# Patient Record
Sex: Female | Born: 1970 | Race: White | Hispanic: No | Marital: Single
Health system: Southern US, Community
[De-identification: ages and names within clinical notes are randomized; demographics above are authoritative.]

---

## 1998-01-19 ENCOUNTER — Emergency Department (HOSPITAL_COMMUNITY): Admission: EM | Admit: 1998-01-19 | Discharge: 1998-01-19 | Payer: Self-pay | Admitting: Emergency Medicine

## 1998-04-15 ENCOUNTER — Emergency Department (HOSPITAL_COMMUNITY): Admission: EM | Admit: 1998-04-15 | Discharge: 1998-04-15 | Payer: Self-pay | Admitting: Emergency Medicine

## 1998-04-17 ENCOUNTER — Emergency Department (HOSPITAL_COMMUNITY): Admission: EM | Admit: 1998-04-17 | Discharge: 1998-04-17 | Payer: Self-pay | Admitting: Emergency Medicine

## 1998-04-17 ENCOUNTER — Encounter: Payer: Self-pay | Admitting: Emergency Medicine

## 1998-11-28 ENCOUNTER — Emergency Department (HOSPITAL_COMMUNITY): Admission: EM | Admit: 1998-11-28 | Discharge: 1998-11-28 | Payer: Self-pay | Admitting: Emergency Medicine

## 1999-04-11 ENCOUNTER — Encounter: Payer: Self-pay | Admitting: Emergency Medicine

## 1999-04-11 ENCOUNTER — Emergency Department (HOSPITAL_COMMUNITY): Admission: EM | Admit: 1999-04-11 | Discharge: 1999-04-11 | Payer: Self-pay | Admitting: Emergency Medicine

## 1999-05-30 ENCOUNTER — Emergency Department (HOSPITAL_COMMUNITY): Admission: EM | Admit: 1999-05-30 | Discharge: 1999-05-30 | Payer: Self-pay | Admitting: Emergency Medicine

## 1999-08-20 ENCOUNTER — Inpatient Hospital Stay (HOSPITAL_COMMUNITY): Admission: AD | Admit: 1999-08-20 | Discharge: 1999-08-20 | Payer: Self-pay | Admitting: Obstetrics & Gynecology

## 1999-09-27 ENCOUNTER — Emergency Department (HOSPITAL_COMMUNITY): Admission: EM | Admit: 1999-09-27 | Discharge: 1999-09-27 | Payer: Self-pay | Admitting: Emergency Medicine

## 1999-09-28 ENCOUNTER — Encounter: Payer: Self-pay | Admitting: Emergency Medicine

## 1999-09-28 ENCOUNTER — Emergency Department (HOSPITAL_COMMUNITY): Admission: EM | Admit: 1999-09-28 | Discharge: 1999-09-28 | Payer: Self-pay | Admitting: Emergency Medicine

## 1999-12-09 ENCOUNTER — Encounter (INDEPENDENT_AMBULATORY_CARE_PROVIDER_SITE_OTHER): Payer: Self-pay | Admitting: *Deleted

## 1999-12-09 LAB — CONVERTED CEMR LAB

## 2000-01-02 ENCOUNTER — Encounter: Admission: RE | Admit: 2000-01-02 | Discharge: 2000-01-02 | Payer: Self-pay | Admitting: Sports Medicine

## 2000-01-23 ENCOUNTER — Encounter: Admission: RE | Admit: 2000-01-23 | Discharge: 2000-01-23 | Payer: Self-pay | Admitting: Family Medicine

## 2000-01-31 ENCOUNTER — Encounter: Payer: Self-pay | Admitting: Family Medicine

## 2000-01-31 ENCOUNTER — Encounter: Admission: RE | Admit: 2000-01-31 | Discharge: 2000-01-31 | Payer: Self-pay | Admitting: Family Medicine

## 2000-03-04 ENCOUNTER — Encounter: Admission: RE | Admit: 2000-03-04 | Discharge: 2000-03-04 | Payer: Self-pay | Admitting: Family Medicine

## 2000-03-11 ENCOUNTER — Encounter: Payer: Self-pay | Admitting: Family Medicine

## 2000-03-11 ENCOUNTER — Encounter: Admission: RE | Admit: 2000-03-11 | Discharge: 2000-03-11 | Payer: Self-pay | Admitting: Family Medicine

## 2000-08-22 ENCOUNTER — Emergency Department (HOSPITAL_COMMUNITY): Admission: EM | Admit: 2000-08-22 | Discharge: 2000-08-22 | Payer: Self-pay | Admitting: Emergency Medicine

## 2000-08-22 ENCOUNTER — Encounter: Payer: Self-pay | Admitting: Emergency Medicine

## 2004-09-20 ENCOUNTER — Emergency Department: Payer: Self-pay | Admitting: Emergency Medicine

## 2005-03-10 ENCOUNTER — Emergency Department: Payer: Self-pay | Admitting: Emergency Medicine

## 2005-06-02 ENCOUNTER — Emergency Department: Payer: Self-pay | Admitting: Emergency Medicine

## 2005-09-13 ENCOUNTER — Emergency Department: Payer: Self-pay | Admitting: Emergency Medicine

## 2005-09-13 ENCOUNTER — Other Ambulatory Visit: Payer: Self-pay

## 2005-09-21 ENCOUNTER — Other Ambulatory Visit: Payer: Self-pay

## 2005-09-21 ENCOUNTER — Emergency Department: Payer: Self-pay | Admitting: Emergency Medicine

## 2006-01-28 ENCOUNTER — Other Ambulatory Visit: Payer: Self-pay

## 2006-01-28 ENCOUNTER — Emergency Department: Payer: Self-pay | Admitting: Emergency Medicine

## 2006-01-30 ENCOUNTER — Ambulatory Visit: Payer: Self-pay | Admitting: Emergency Medicine

## 2006-05-15 ENCOUNTER — Inpatient Hospital Stay: Payer: Self-pay | Admitting: Internal Medicine

## 2006-05-15 ENCOUNTER — Other Ambulatory Visit: Payer: Self-pay

## 2006-06-07 ENCOUNTER — Encounter (INDEPENDENT_AMBULATORY_CARE_PROVIDER_SITE_OTHER): Payer: Self-pay | Admitting: *Deleted

## 2007-02-12 ENCOUNTER — Emergency Department: Payer: Self-pay | Admitting: Emergency Medicine

## 2007-08-18 ENCOUNTER — Emergency Department: Payer: Self-pay | Admitting: Emergency Medicine

## 2007-08-20 ENCOUNTER — Inpatient Hospital Stay: Payer: Self-pay | Admitting: Internal Medicine

## 2007-11-20 ENCOUNTER — Emergency Department: Payer: Self-pay | Admitting: Emergency Medicine

## 2007-12-23 ENCOUNTER — Emergency Department: Payer: Self-pay | Admitting: Emergency Medicine

## 2008-06-08 ENCOUNTER — Inpatient Hospital Stay: Payer: Self-pay | Admitting: Internal Medicine

## 2009-03-05 ENCOUNTER — Emergency Department: Payer: Self-pay | Admitting: Unknown Physician Specialty

## 2009-07-20 ENCOUNTER — Emergency Department: Payer: Self-pay | Admitting: Emergency Medicine

## 2009-08-31 ENCOUNTER — Emergency Department: Payer: Self-pay | Admitting: Internal Medicine

## 2009-09-30 ENCOUNTER — Ambulatory Visit: Payer: Self-pay | Admitting: Internal Medicine

## 2009-11-04 ENCOUNTER — Emergency Department: Payer: Self-pay | Admitting: Emergency Medicine

## 2009-11-13 ENCOUNTER — Inpatient Hospital Stay: Payer: Self-pay | Admitting: Internal Medicine

## 2010-03-01 ENCOUNTER — Emergency Department: Payer: Self-pay | Admitting: Emergency Medicine

## 2010-07-27 ENCOUNTER — Ambulatory Visit (HOSPITAL_COMMUNITY)
Admission: RE | Admit: 2010-07-27 | Discharge: 2010-07-27 | Disposition: A | Discharge status: Home / Self Care | Payer: BC Managed Care – HMO | Source: Ambulatory Visit | Attending: Neurosurgery | Admitting: Neurosurgery

## 2010-07-27 ENCOUNTER — Other Ambulatory Visit (HOSPITAL_COMMUNITY): Payer: Self-pay | Admitting: Neurosurgery

## 2010-07-27 ENCOUNTER — Encounter (HOSPITAL_COMMUNITY)
Admission: RE | Admit: 2010-07-27 | Discharge: 2010-07-27 | Disposition: A | Discharge status: Home / Self Care | Payer: BC Managed Care – HMO | Source: Ambulatory Visit | Attending: Neurosurgery | Admitting: Neurosurgery

## 2010-07-27 DIAGNOSIS — Z01812 Encounter for preprocedural laboratory examination: Secondary | ICD-10-CM | POA: Insufficient documentation

## 2010-07-27 DIAGNOSIS — Z01810 Encounter for preprocedural cardiovascular examination: Secondary | ICD-10-CM | POA: Insufficient documentation

## 2010-07-27 DIAGNOSIS — Z01811 Encounter for preprocedural respiratory examination: Secondary | ICD-10-CM

## 2010-07-27 DIAGNOSIS — Z01818 Encounter for other preprocedural examination: Secondary | ICD-10-CM | POA: Insufficient documentation

## 2010-07-27 LAB — CBC
HCT: 46.5 % — ABNORMAL HIGH (ref 36.0–46.0)
Hemoglobin: 15.9 g/dL — ABNORMAL HIGH (ref 12.0–15.0)
MCH: 30.9 pg (ref 26.0–34.0)
MCV: 90.3 fL (ref 78.0–100.0)
Platelets: 349 10*3/uL (ref 150–400)
RBC: 5.15 MIL/uL — ABNORMAL HIGH (ref 3.87–5.11)

## 2010-07-27 LAB — BASIC METABOLIC PANEL
BUN: 8 mg/dL (ref 6–23)
CO2: 30 mEq/L (ref 19–32)
Chloride: 105 mEq/L (ref 96–112)
Creatinine, Ser: 0.62 mg/dL (ref 0.4–1.2)
Potassium: 4.4 mEq/L (ref 3.5–5.1)

## 2010-07-27 LAB — HCG, SERUM, QUALITATIVE: Preg, Serum: NEGATIVE

## 2010-07-31 ENCOUNTER — Observation Stay (HOSPITAL_COMMUNITY): Payer: BC Managed Care – HMO

## 2010-07-31 ENCOUNTER — Observation Stay (HOSPITAL_COMMUNITY)
Admission: RE | Admit: 2010-07-31 | Discharge: 2010-08-01 | Disposition: A | Discharge status: Home / Self Care | Payer: BC Managed Care – HMO | Source: Ambulatory Visit | Attending: Neurosurgery | Admitting: Neurosurgery

## 2010-07-31 DIAGNOSIS — M5020 Other cervical disc displacement, unspecified cervical region: Secondary | ICD-10-CM | POA: Insufficient documentation

## 2010-07-31 DIAGNOSIS — M47812 Spondylosis without myelopathy or radiculopathy, cervical region: Principal | ICD-10-CM | POA: Insufficient documentation

## 2010-08-06 ENCOUNTER — Inpatient Hospital Stay (HOSPITAL_COMMUNITY)
Admission: EM | Admit: 2010-08-06 | Discharge: 2010-08-08 | DRG: 397 | Disposition: A | Discharge status: Home / Self Care | Payer: BC Managed Care – HMO | Attending: Neurosurgery | Admitting: Neurosurgery

## 2010-08-06 DIAGNOSIS — Y92009 Unspecified place in unspecified non-institutional (private) residence as the place of occurrence of the external cause: Secondary | ICD-10-CM

## 2010-08-06 DIAGNOSIS — Z86711 Personal history of pulmonary embolism: Secondary | ICD-10-CM

## 2010-08-06 DIAGNOSIS — R1310 Dysphagia, unspecified: Secondary | ICD-10-CM | POA: Diagnosis present

## 2010-08-06 DIAGNOSIS — R233 Spontaneous ecchymoses: Principal | ICD-10-CM | POA: Diagnosis present

## 2010-08-06 DIAGNOSIS — Z981 Arthrodesis status: Secondary | ICD-10-CM

## 2010-08-06 DIAGNOSIS — T45515A Adverse effect of anticoagulants, initial encounter: Secondary | ICD-10-CM | POA: Diagnosis present

## 2010-08-06 DIAGNOSIS — Z86718 Personal history of other venous thrombosis and embolism: Secondary | ICD-10-CM

## 2010-08-06 LAB — CBC
Hemoglobin: 12.7 g/dL (ref 12.0–15.0)
MCH: 29.9 pg (ref 26.0–34.0)
MCHC: 32.7 g/dL (ref 30.0–36.0)
MCV: 91.3 fL (ref 78.0–100.0)
Platelets: 312 10*3/uL (ref 150–400)
RBC: 4.25 MIL/uL (ref 3.87–5.11)

## 2010-08-06 LAB — GLUCOSE, CAPILLARY

## 2010-08-06 LAB — PROTIME-INR
INR: 2.06 — ABNORMAL HIGH (ref 0.00–1.49)
Prothrombin Time: 23.4 seconds — ABNORMAL HIGH (ref 11.6–15.2)

## 2010-08-06 LAB — BASIC METABOLIC PANEL
BUN: 6 mg/dL (ref 6–23)
CO2: 28 mEq/L (ref 19–32)
Calcium: 8.7 mg/dL (ref 8.4–10.5)
Chloride: 100 mEq/L (ref 96–112)
Creatinine, Ser: 0.65 mg/dL (ref 0.4–1.2)
GFR calc Af Amer: >60 mL/min (ref >60)

## 2010-08-06 LAB — APTT: aPTT: 62 seconds — ABNORMAL HIGH (ref 24–37)

## 2010-08-07 ENCOUNTER — Inpatient Hospital Stay (HOSPITAL_COMMUNITY): Payer: BC Managed Care – HMO

## 2010-08-07 LAB — APTT: aPTT: 56 seconds — ABNORMAL HIGH (ref 24–37)

## 2010-08-07 LAB — PROTIME-INR
INR: 2.05 — ABNORMAL HIGH (ref 0.00–1.49)
INR: 1.19 (ref 0.00–1.49)
INR: 1.12 (ref 0.00–1.49)
INR: 1.12 (ref 0.00–1.49)
Prothrombin Time: 23.3 seconds — ABNORMAL HIGH (ref 11.6–15.2)
Prothrombin Time: 14.6 seconds (ref 11.6–15.2)

## 2010-08-07 LAB — GLUCOSE, CAPILLARY
Glucose-Capillary: 127 mg/dL — ABNORMAL HIGH (ref 70–99)
Glucose-Capillary: 155 mg/dL — ABNORMAL HIGH (ref 70–99)
Glucose-Capillary: 150 mg/dL — ABNORMAL HIGH (ref 70–99)

## 2010-08-07 LAB — TYPE AND SCREEN: ABO/RH(D): O NEG

## 2010-08-07 LAB — ABO/RH: ABO/RH(D): O NEG

## 2010-08-07 MED ORDER — IOHEXOL 300 MG/ML  SOLN
75.0000 mL | Freq: Once | INTRAMUSCULAR | Status: AC | PRN
  Administered 2010-08-07: 75 mL via INTRAVENOUS

## 2010-08-08 ENCOUNTER — Inpatient Hospital Stay (HOSPITAL_COMMUNITY): Payer: BC Managed Care – HMO

## 2010-08-08 LAB — GLUCOSE, CAPILLARY
Glucose-Capillary: 196 mg/dL — ABNORMAL HIGH (ref 70–99)
Glucose-Capillary: 170 mg/dL — ABNORMAL HIGH (ref 70–99)

## 2010-08-08 LAB — PREPARE FRESH FROZEN PLASMA
Unit division: 0
Unit division: 0
Unit division: 0

## 2010-08-08 LAB — PROTIME-INR: INR: 1.08 (ref 0.00–1.49)

## 2010-08-10 NOTE — Op Note (Signed)
NAMECECILIE, Sharon Strickland             ACCOUNT NO.:  [id number]  MEDICAL RECORD NO.:  [id number]           PATIENT TYPE:  O  LOCATION:  3533                         FACILITY:  [facility]  PHYSICIAN:  Donalee Citrin, M.D.        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  PROCEDURE:  Intracervical diskectomies and fusion at C4-5 and C5-6 using Globus PEEK cages, packed with locally harvested autograft mixed with Actifuse, and Atlantis translational plate with six 16-XW fixed angle screws.  SURGEON:  Donalee Citrin, MD.  ASSISTANT:  Kathaleen Maser. Pool, MD.  ANESTHESIA:  General endotracheal.  HISTORY OF PRESENT ILLNESS:  The patient is a 40 year old female with longstanding neck and bilateral arm pain with weakness in the right biceps, triceps, numbness and tingling in her right arm.  MRI scan showed large disk herniation, causing severe spinal cord compression, right C6 nerve root compression, as well as the C4-5 severe spinal cord compression and left C5 nerve root compression.  Due to the patient's clinical exam, MRI findings, failure to conservative treatment, the patient is recommended anterior cervical diskectomies and fusion with the risks and benefits of the operation were explained to the patient. She understood and agreed to proceed forth.  DESCRIPTION OF PROCEDURE:  The patient was brought to the OR, was induced under general anesthesia, positioned supine, with then neck in slight extension and 5 pounds of halter traction.  The right side of her neck was prepped and draped in usual sterile fashion.  Preop incision was localized at the appropriate level.  A curvilinear incision was made and drawn out just off the midline to the anterior border of  the sternocleidomastoid.  The superficial layer of the platysma was dissected out and divided longitudinally.  The avascular plane between the sternocleidomastoid and strap muscle was developed down to the prevertebral fascia.  Prevertebral fascia was dissected with Kitners. The intraoperative x-ray identified at the appropriate level, so annulotomy was made with 15-mm scalpel to mark the disk space and then both disk spaces were identified.  Longus colli was reflected laterally and self-retaining retractor was placed.  Then, both disk spaces were incised.  Large anterior osteophytes were bitten off with 3-mm Kerrison punch and then high-speed drill was drilled down both disk spaces and capturing the bone shavings in mucus trap.  At this point, the operating microscope was draped, brought into the field, and under microscopic illumination, first working at C5-6 disk space was further drilled down the post annulus.  Then, a 1-mm Kerrison punch was used underbite the medial and posterior end plates, and then marching across laterally, the left-sided dura was identified.  Left C6 pedicle was identified and left C6 nerve root was skeletonized, flushed with pedicle, and marching across the right.  Several large free fragments additionally removed from the right C6 foramen.  These were all teased away with a nerve hook and removed  with pituitary rongeurs and then 2-mm Kerrison punch was used to radically skeletonize the C6 nerve root, flushed with pedicle to confirm adequate decompression.  Again, aggressive under biting at both endplates, decompressed the central canal.  This was then packed with Gelfoam.  Attention was then taken to the C4-5.  In similar fashion, C4- 5 was drilled down.  C4-5 compression was predominantly osteophyte and coming off the C4 vertebral body displacing the left side of the spinal cord.  This was all aggressively under bitten.  Both C5 pedicles were identified.   Both C5 nerve roots were under bitten and then both endplates were scraped.  Two PEEK cages, 8 lordotic at C4-5 and 8 straight at C5-6 were all placed after being packed with locally harvested autograft mixed with Actifuse, and then a 40-mm Atlantis translational plate was placed.  All screws had excellent purchase. Locking mechanisms were engaged.  Wounds were then copiously irrigated. Meticulous hemostasis was maintained.  Postop fluoroscopy confirmed good position of plate screws and bone graft.  The wounds were closed in layers with interrupted Vicryl, and the skin was closed with a running 4- 0 subcuticular.  Benzoin and Steri-Strips were applied.  The patient went to the recovery room in stable condition.  At the end of the case, sponge and instrument count were correct.          ______________________________ Donalee Citrin, M.D.     GC/MEDQ  D:  07/31/2010  T:  08/01/2010  Job:  [id number]  Electronically Signed by Donalee Citrin M.D. on 08/10/2010 11:22:38 AM

## 2010-08-24 ENCOUNTER — Emergency Department (HOSPITAL_COMMUNITY)
Admission: EM | Admit: 2010-08-24 | Discharge: 2010-08-24 | Disposition: A | Discharge status: Home / Self Care | Payer: BC Managed Care – HMO | Attending: Emergency Medicine | Admitting: Emergency Medicine

## 2010-08-24 ENCOUNTER — Emergency Department (HOSPITAL_COMMUNITY): Payer: BC Managed Care – HMO

## 2010-08-24 DIAGNOSIS — R05 Cough: Secondary | ICD-10-CM | POA: Insufficient documentation

## 2010-08-24 DIAGNOSIS — Z7901 Long term (current) use of anticoagulants: Secondary | ICD-10-CM | POA: Insufficient documentation

## 2010-08-24 DIAGNOSIS — E119 Type 2 diabetes mellitus without complications: Secondary | ICD-10-CM | POA: Insufficient documentation

## 2010-08-24 DIAGNOSIS — R059 Cough, unspecified: Secondary | ICD-10-CM | POA: Insufficient documentation

## 2010-08-24 DIAGNOSIS — R0989 Other specified symptoms and signs involving the circulatory and respiratory systems: Secondary | ICD-10-CM | POA: Insufficient documentation

## 2010-08-24 DIAGNOSIS — R042 Hemoptysis: Secondary | ICD-10-CM | POA: Insufficient documentation

## 2010-08-24 DIAGNOSIS — R0602 Shortness of breath: Secondary | ICD-10-CM | POA: Insufficient documentation

## 2010-08-24 DIAGNOSIS — J40 Bronchitis, not specified as acute or chronic: Secondary | ICD-10-CM | POA: Insufficient documentation

## 2010-08-24 DIAGNOSIS — R609 Edema, unspecified: Secondary | ICD-10-CM | POA: Insufficient documentation

## 2010-08-24 DIAGNOSIS — Z86718 Personal history of other venous thrombosis and embolism: Secondary | ICD-10-CM | POA: Insufficient documentation

## 2010-08-24 DIAGNOSIS — R0609 Other forms of dyspnea: Secondary | ICD-10-CM | POA: Insufficient documentation

## 2010-08-24 DIAGNOSIS — Z79899 Other long term (current) drug therapy: Secondary | ICD-10-CM | POA: Insufficient documentation

## 2010-08-24 LAB — DIFFERENTIAL
Basophils Relative: 0 % (ref 0–1)
Eosinophils Absolute: 0.2 10*3/uL (ref 0.0–0.7)
Lymphs Abs: 3.7 10*3/uL (ref 0.7–4.0)
Monocytes Relative: 9 % (ref 3–12)
Neutro Abs: 4.7 10*3/uL (ref 1.7–7.7)
Neutrophils Relative %: 50 % (ref 43–77)

## 2010-08-24 LAB — COMPREHENSIVE METABOLIC PANEL
ALT: 26 U/L (ref 0–35)
AST: 20 U/L (ref 0–37)
Albumin: 3.8 g/dL (ref 3.5–5.2)
Alkaline Phosphatase: 53 U/L (ref 39–117)
Chloride: 104 mEq/L (ref 96–112)
GFR calc Af Amer: >60 mL/min (ref >60)
Potassium: 3.9 mEq/L (ref 3.5–5.1)
Sodium: 140 mEq/L (ref 135–145)
Total Protein: 7 g/dL (ref 6.0–8.3)

## 2010-08-24 LAB — CBC
Hemoglobin: 14.4 g/dL (ref 12.0–15.0)
Platelets: 371 10*3/uL (ref 150–400)
RBC: 4.71 MIL/uL (ref 3.87–5.11)
WBC: 9.5 10*3/uL (ref 4.0–10.5)

## 2010-08-24 LAB — URINALYSIS, ROUTINE W REFLEX MICROSCOPIC
Glucose, UA: NEGATIVE mg/dL
Nitrite: NEGATIVE
Specific Gravity, Urine: 1.027 (ref 1.005–1.030)
pH: 6 (ref 5.0–8.0)

## 2010-08-24 LAB — PROTIME-INR: INR: 4.66 — ABNORMAL HIGH (ref 0.00–1.49)

## 2010-09-08 NOTE — Discharge Summary (Signed)
  Sharon Strickland, Sharon Strickland             ACCOUNT NO.:  [id number]  MEDICAL RECORD NO.:  [id number]           PATIENT TYPE:  O  LOCATION:  3533                         FACILITY:  [facility]  PHYSICIAN:  Donalee Citrin, M.D.        DATE OF BIRTH:  23-Aug-1970  DATE OF ADMISSION:  07/31/2010 DATE OF DISCHARGE:  08/01/2010                              DISCHARGE SUMMARY   ADMITTING DIAGNOSIS:  Cervical spondylosis with radiculopathy.  PROCEDURE DURING THIS HOSPITALIZATION:  Anterior cervical diskectomy and fusion at C4-5 and C5-6.  FINAL DIAGNOSIS:  Cervical spondylosis.  HOSPITAL COURSE:  The patient was admitted as an EMA and went to the operating room and underwent the aforementioned procedure. Postoperatively, the patient did very well and recovering on the floor. On the floor, the patient convalescing well with significant improvement of her preoperative arm pain.  She was swallowing and tolerating regular diet and by day 1 was able to be discharged home and told to start back with her anticoagulation for her hypercoagulable history with PEs with Coumadin and a couple of days of Lovenox and with scheduled followup approximately in 1 week.          ______________________________ Donalee Citrin, M.D.     GC/MEDQ  D:  08/10/2010  T:  08/10/2010  Job:  [id number]  Electronically Signed by Donalee Citrin M.D. on 09/08/2010 06:39:36 AM

## 2010-09-19 ENCOUNTER — Ambulatory Visit
Admission: RE | Admit: 2010-09-19 | Discharge: 2010-09-19 | Disposition: A | Discharge status: Home / Self Care | Payer: BC Managed Care – HMO | Source: Ambulatory Visit | Attending: Neurosurgery | Admitting: Neurosurgery

## 2010-09-19 ENCOUNTER — Other Ambulatory Visit: Payer: Self-pay | Admitting: Neurosurgery

## 2010-09-19 DIAGNOSIS — M542 Cervicalgia: Secondary | ICD-10-CM

## 2010-09-27 NOTE — Discharge Summary (Signed)
  Sharon Strickland, RAYMOND             ACCOUNT NO.:  [id number]  MEDICAL RECORD NO.:  [id number]           PATIENT TYPE:  I  LOCATION:  3110                         FACILITY:  [facility]  PHYSICIAN:  Donalee Citrin, M.D.        DATE OF BIRTH:  1970-12-24  DATE OF ADMISSION:  08/06/2010 DATE OF DISCHARGE:  08/08/2010                              DISCHARGE SUMMARY   ADMITTING DIAGNOSIS:  Neck swelling, possible cervical hematoma.  DISCHARGE DIAGNOSIS:  Cervical wound swelling postoperatively.  HOSPITAL COURSE:  The patient was admitted to the emergency department with difficulty swallowing and swelling around the incision.  The patient had been placed on Coumadin due to her significant history of PE.  The patient was not having any trouble breathing on admission.  She was admitted to the ICU for observation, was reversed from coagulopathy, placed on IV steroids.  On postop day #1, the swelling went down significantly.  Her swallowing was significantly improved.  CT scan of her neck showed no significant hematoma, mostly the soft tissue edema. The patient was maintained on steroids and observed while she reversed from her coagulopathy whenever came back to where they were within normal limits.  She was observed for another couple additional days, transferred to the floor, and ultimately the patient was able be discharged home on May 1 and slowly to initiate her Coumadin when she gets home.          ______________________________ Donalee Citrin, M.D.     GC/MEDQ  D:  09/08/2010  T:  09/08/2010  Job:  [id number]  Electronically Signed by Donalee Citrin M.D. on 09/27/2010 08:32:56 AM

## 2010-10-09 ENCOUNTER — Inpatient Hospital Stay: Payer: Self-pay | Admitting: Psychiatry

## 2010-12-24 ENCOUNTER — Emergency Department: Payer: Self-pay | Admitting: Emergency Medicine

## 2011-02-05 ENCOUNTER — Emergency Department: Payer: Self-pay

## 2011-05-15 ENCOUNTER — Emergency Department: Payer: Self-pay | Admitting: Emergency Medicine

## 2011-05-15 LAB — TROPONIN I
Troponin-I: <0.02 ng/mL
Troponin-I: <0.02 ng/mL

## 2011-05-15 LAB — CBC
HCT: 39.7 % (ref 35.0–47.0)
HGB: 13.7 g/dL (ref 12.0–16.0)
MCH: 30.9 pg (ref 26.0–34.0)
MCHC: 34.5 g/dL (ref 32.0–36.0)
MCV: 90 fL (ref 80–100)
RDW: 12.9 % (ref 11.5–14.5)

## 2011-05-15 LAB — COMPREHENSIVE METABOLIC PANEL
Albumin: 3.5 g/dL (ref 3.4–5.0)
Anion Gap: 10 (ref 7–16)
BUN: 10 mg/dL (ref 7–18)
Calcium, Total: 8.3 mg/dL — ABNORMAL LOW (ref 8.5–10.1)
Chloride: 105 mmol/L (ref 98–107)
Co2: 29 mmol/L (ref 21–32)
EGFR (African American): >60
Osmolality: 286 (ref 275–301)
Potassium: 3.6 mmol/L (ref 3.5–5.1)
SGOT(AST): 23 U/L (ref 15–37)
Total Protein: 6.6 g/dL (ref 6.4–8.2)

## 2011-05-15 LAB — HCG, QUANTITATIVE, PREGNANCY: Beta Hcg, Quant.: <1 m[IU]/mL — ABNORMAL LOW

## 2011-05-16 ENCOUNTER — Inpatient Hospital Stay: Payer: Self-pay | Admitting: Internal Medicine

## 2011-05-16 LAB — PREGNANCY, URINE: Pregnancy Test, Urine: NEGATIVE m[IU]/mL

## 2011-05-16 LAB — CBC
HGB: 13.1 g/dL (ref 12.0–16.0)
MCV: 90 fL (ref 80–100)
Platelet: 336 10*3/uL (ref 150–440)
RBC: 4.35 10*6/uL (ref 3.80–5.20)
WBC: 11.5 10*3/uL — ABNORMAL HIGH (ref 3.6–11.0)

## 2011-05-16 LAB — COMPREHENSIVE METABOLIC PANEL
Alkaline Phosphatase: 49 U/L — ABNORMAL LOW (ref 50–136)
Bilirubin,Total: 0.5 mg/dL (ref 0.2–1.0)
Chloride: 102 mmol/L (ref 98–107)
Co2: 30 mmol/L (ref 21–32)
Creatinine: 0.7 mg/dL (ref 0.60–1.30)
EGFR (African American): >60
EGFR (Non-African Amer.): >60
Osmolality: 282 (ref 275–301)
Sodium: 142 mmol/L (ref 136–145)
Total Protein: 6.7 g/dL (ref 6.4–8.2)

## 2011-05-16 LAB — URINALYSIS, COMPLETE
Bacteria: NONE SEEN
Bilirubin,UR: NEGATIVE
Blood: NEGATIVE
Ketone: NEGATIVE
Leukocyte Esterase: NEGATIVE
Ph: 5 (ref 4.5–8.0)
Specific Gravity: 1.018 (ref 1.003–1.030)

## 2011-05-16 LAB — ETHANOL: Ethanol: <3 mg/dL

## 2011-05-16 LAB — ACETAMINOPHEN LEVEL: Acetaminophen: 62 ug/mL — ABNORMAL HIGH

## 2011-05-16 LAB — DRUG SCREEN, URINE
Amphetamines, Ur Screen: NEGATIVE (ref ?–1000)
Barbiturates, Ur Screen: NEGATIVE (ref ?–200)
Benzodiazepine, Ur Scrn: NEGATIVE (ref ?–200)
Methadone, Ur Screen: NEGATIVE (ref ?–300)
Opiate, Ur Screen: POSITIVE (ref ?–300)
Phencyclidine (PCP) Ur S: NEGATIVE (ref ?–25)
Tricyclic, Ur Screen: NEGATIVE (ref ?–1000)

## 2011-05-16 LAB — PROTIME-INR
INR: 3.1
Prothrombin Time: 32.2 secs — ABNORMAL HIGH (ref 11.5–14.7)

## 2011-05-17 LAB — COMPREHENSIVE METABOLIC PANEL
Albumin: 3.3 g/dL — ABNORMAL LOW (ref 3.4–5.0)
Alkaline Phosphatase: 49 U/L — ABNORMAL LOW (ref 50–136)
BUN: 9 mg/dL (ref 7–18)
Calcium, Total: 8.6 mg/dL (ref 8.5–10.1)
Creatinine: 0.72 mg/dL (ref 0.60–1.30)
EGFR (Non-African Amer.): >60
Glucose: 133 mg/dL — ABNORMAL HIGH (ref 65–99)
Osmolality: 278 (ref 275–301)
Sodium: 139 mmol/L (ref 136–145)

## 2011-05-17 LAB — CBC WITH DIFFERENTIAL/PLATELET
Basophil #: 0.1 10*3/uL (ref 0.0–0.1)
Eosinophil #: 0 10*3/uL (ref 0.0–0.7)
HCT: 37.7 % (ref 35.0–47.0)
Lymphocyte %: 6.6 %
MCH: 30.7 pg (ref 26.0–34.0)
MCHC: 34.5 g/dL (ref 32.0–36.0)
Monocyte #: 1 10*3/uL — ABNORMAL HIGH (ref 0.0–0.7)
Neutrophil #: 17.3 10*3/uL — ABNORMAL HIGH (ref 1.4–6.5)
Neutrophil %: 88 %
RDW: 13.8 % (ref 11.5–14.5)

## 2011-05-18 LAB — CBC WITH DIFFERENTIAL/PLATELET
Basophil #: 0 10*3/uL (ref 0.0–0.1)
Eosinophil #: 0.1 10*3/uL (ref 0.0–0.7)
HCT: 36.2 % (ref 35.0–47.0)
Lymphocyte %: 19.5 %
MCHC: 33.9 g/dL (ref 32.0–36.0)
Monocyte %: 8.7 %
Neutrophil #: 8.1 10*3/uL — ABNORMAL HIGH (ref 1.4–6.5)
Neutrophil %: 70.9 %
Platelet: 302 10*3/uL (ref 150–440)
RDW: 13.3 % (ref 11.5–14.5)
WBC: 11.4 10*3/uL — ABNORMAL HIGH (ref 3.6–11.0)

## 2011-05-18 LAB — HEPATIC FUNCTION PANEL A (ARMC)
Bilirubin,Total: 0.3 mg/dL (ref 0.2–1.0)
SGOT(AST): 16 U/L (ref 15–37)
SGOT(AST): 37 U/L (ref 15–37)
SGPT (ALT): 32 U/L
Total Protein: 5.9 g/dL — ABNORMAL LOW (ref 6.4–8.2)
Total Protein: 6.5 g/dL (ref 6.4–8.2)

## 2011-05-18 LAB — PROTIME-INR
INR: >15.1
Prothrombin Time: >120 secs (ref 11.5–14.7)
Prothrombin Time: 30.1 secs — ABNORMAL HIGH (ref 11.5–14.7)

## 2011-05-18 LAB — BASIC METABOLIC PANEL
Anion Gap: 8 (ref 7–16)
BUN: 4 mg/dL — ABNORMAL LOW (ref 7–18)
Chloride: 104 mmol/L (ref 98–107)
Creatinine: 0.44 mg/dL — ABNORMAL LOW (ref 0.60–1.30)
Sodium: 144 mmol/L (ref 136–145)

## 2011-05-19 ENCOUNTER — Inpatient Hospital Stay: Payer: Self-pay | Admitting: Psychiatry

## 2011-05-19 LAB — BASIC METABOLIC PANEL
Anion Gap: 10 (ref 7–16)
BUN: 7 mg/dL (ref 7–18)
Calcium, Total: 8.9 mg/dL (ref 8.5–10.1)
Chloride: 105 mmol/L (ref 98–107)
EGFR (African American): >60
EGFR (Non-African Amer.): >60
Osmolality: 287 (ref 275–301)
Potassium: 3.4 mmol/L — ABNORMAL LOW (ref 3.5–5.1)

## 2011-05-19 LAB — CBC WITH DIFFERENTIAL/PLATELET
Basophil #: 0 10*3/uL (ref 0.0–0.1)
Basophil %: 0.2 %
Eosinophil #: 0.1 10*3/uL (ref 0.0–0.7)
HCT: 34.6 % — ABNORMAL LOW (ref 35.0–47.0)
HGB: 11.8 g/dL — ABNORMAL LOW (ref 12.0–16.0)
Lymphocyte %: 28.6 %
MCHC: 34.1 g/dL (ref 32.0–36.0)
Monocyte %: 9.4 %
Neutrophil #: 6.3 10*3/uL (ref 1.4–6.5)
Neutrophil %: 60.6 %
WBC: 10.4 10*3/uL (ref 3.6–11.0)

## 2011-05-19 LAB — PROTIME-INR
INR: 3.8
Prothrombin Time: 37.3 secs — ABNORMAL HIGH (ref 11.5–14.7)

## 2011-05-21 LAB — PROTIME-INR
INR: 3
Prothrombin Time: 31.6 secs — ABNORMAL HIGH (ref 11.5–14.7)

## 2011-05-22 LAB — HEMOGLOBIN: HGB: 13.8 g/dL (ref 12.0–16.0)

## 2011-05-23 LAB — PROTIME-INR: INR: 2.1

## 2011-05-25 LAB — PROTIME-INR: INR: 1.8

## 2011-05-26 LAB — PROTIME-INR
INR: 1.9
Prothrombin Time: 21.9 secs — ABNORMAL HIGH (ref 11.5–14.7)

## 2011-05-27 LAB — PROTIME-INR
INR: 1.8
Prothrombin Time: 21 secs — ABNORMAL HIGH (ref 11.5–14.7)

## 2011-07-06 ENCOUNTER — Emergency Department: Payer: Self-pay | Admitting: Emergency Medicine

## 2011-07-06 LAB — COMPREHENSIVE METABOLIC PANEL
Albumin: 3.8 g/dL (ref 3.4–5.0)
Anion Gap: 7 (ref 7–16)
EGFR (African American): >60
Glucose: 89 mg/dL (ref 65–99)
Osmolality: 279 (ref 275–301)
Potassium: 4 mmol/L (ref 3.5–5.1)
Sodium: 141 mmol/L (ref 136–145)
Total Protein: 7 g/dL (ref 6.4–8.2)

## 2011-07-06 LAB — CBC
HCT: 41.1 % (ref 35.0–47.0)
MCH: 30.8 pg (ref 26.0–34.0)
MCV: 91 fL (ref 80–100)
RBC: 4.5 10*6/uL (ref 3.80–5.20)
RDW: 13.6 % (ref 11.5–14.5)
WBC: 12 10*3/uL — ABNORMAL HIGH (ref 3.6–11.0)

## 2011-07-06 LAB — DRUG SCREEN, URINE
Barbiturates, Ur Screen: NEGATIVE (ref ?–200)
Cocaine Metabolite,Ur ~~LOC~~: NEGATIVE (ref ?–300)
Methadone, Ur Screen: NEGATIVE (ref ?–300)
Tricyclic, Ur Screen: NEGATIVE (ref ?–1000)

## 2011-07-20 IMAGING — CR DG CHEST 1V PORT
1 series · 1 of 1 positions shown · non-contrast
Comparison: none

REASON FOR EXAM: weakness
COMMENTS:

[view not recorded]
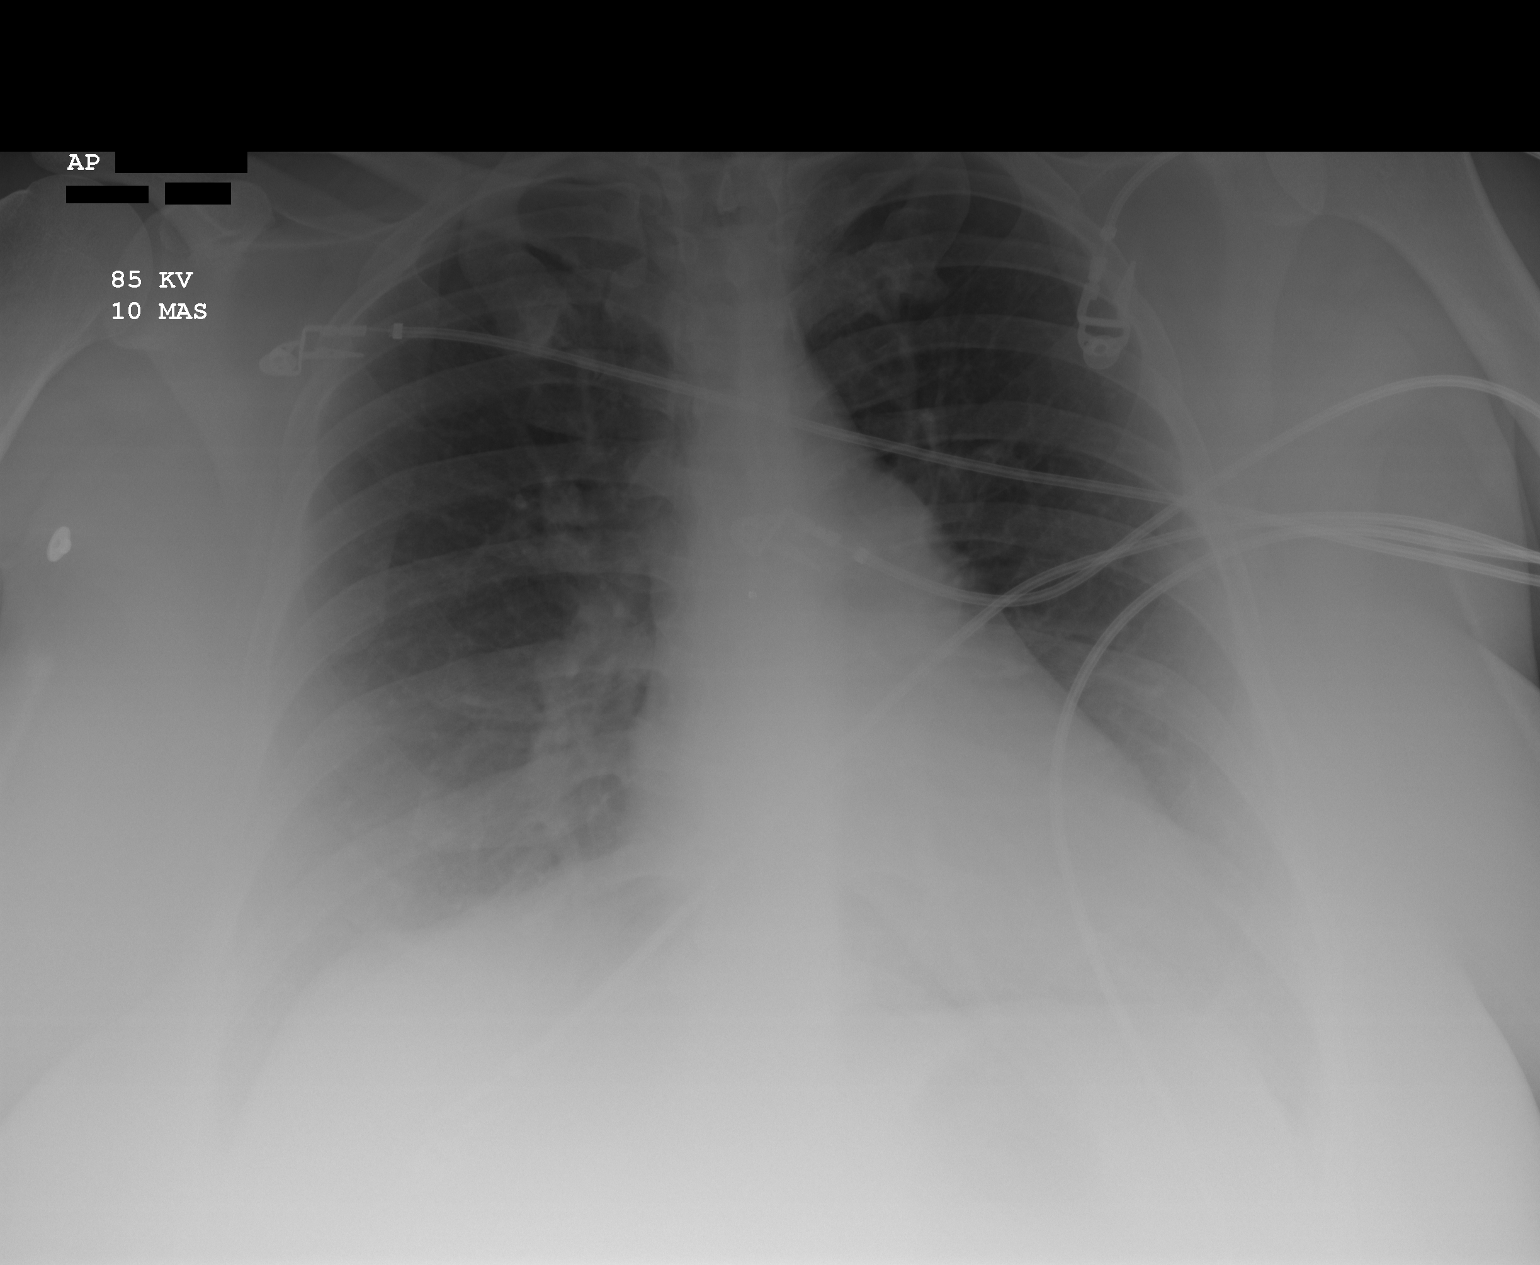

[1 of 1 positions shown; findings below may reference images not displayed]

PROCEDURE:     DXR - DXR PORTABLE CHEST SINGLE VIEW  - October 07, 2010  [DATE]

RESULT:      Comparison is made to a prior study dated 03/05/2009.

This study is under penetrated. With technique taken into consideration,
there is no evidence of focal infiltrates, effusions or edema. The cardiac
silhouette and visualized bony skeleton are unremarkable.
IMPRESSION: Chest radiograph without evidence of acute cardiopulmonary disease.

## 2011-09-26 ENCOUNTER — Inpatient Hospital Stay: Payer: Self-pay | Admitting: Psychiatry

## 2011-09-26 LAB — PROTIME-INR: INR: 1

## 2011-09-26 LAB — URINALYSIS, COMPLETE
Bacteria: NONE SEEN
Glucose,UR: NEGATIVE mg/dL (ref 0–75)
Ketone: NEGATIVE
Leukocyte Esterase: NEGATIVE
Ph: 5 (ref 4.5–8.0)
Protein: 30
RBC,UR: 1 /HPF (ref 0–5)
Squamous Epithelial: 6
WBC UR: 6 /HPF (ref 0–5)

## 2011-09-26 LAB — COMPREHENSIVE METABOLIC PANEL
BUN: 11 mg/dL (ref 7–18)
Calcium, Total: 8.6 mg/dL (ref 8.5–10.1)
Chloride: 109 mmol/L — ABNORMAL HIGH (ref 98–107)
Co2: 27 mmol/L (ref 21–32)
EGFR (African American): >60
Glucose: 85 mg/dL (ref 65–99)
Osmolality: 285 (ref 275–301)
SGPT (ALT): 25 U/L
Sodium: 144 mmol/L (ref 136–145)

## 2011-09-26 LAB — DRUG SCREEN, URINE
Cocaine Metabolite,Ur ~~LOC~~: NEGATIVE (ref ?–300)
MDMA (Ecstasy)Ur Screen: NEGATIVE (ref ?–500)
Opiate, Ur Screen: NEGATIVE (ref ?–300)

## 2011-09-26 LAB — CBC
MCH: 29.5 pg (ref 26.0–34.0)
MCV: 89 fL (ref 80–100)
Platelet: 289 10*3/uL (ref 150–440)
RDW: 13.5 % (ref 11.5–14.5)

## 2011-09-26 LAB — SALICYLATE LEVEL: Salicylates, Serum: 2.8 mg/dL

## 2011-09-26 LAB — LITHIUM LEVEL: Lithium: <0.2 mmol/L — ABNORMAL LOW

## 2011-09-26 LAB — ACETAMINOPHEN LEVEL: Acetaminophen: <2 ug/mL

## 2011-09-26 LAB — TSH: Thyroid Stimulating Horm: 1.61 u[IU]/mL

## 2011-09-27 LAB — BEHAVIORAL MEDICINE 1 PANEL
Albumin: 3.7 g/dL (ref 3.4–5.0)
Alkaline Phosphatase: 63 U/L (ref 50–136)
Anion Gap: 6 — ABNORMAL LOW (ref 7–16)
BUN: 7 mg/dL (ref 7–18)
Basophil #: 0.1 10*3/uL (ref 0.0–0.1)
Bilirubin,Total: 0.9 mg/dL (ref 0.2–1.0)
Calcium, Total: 8.6 mg/dL (ref 8.5–10.1)
Chloride: 109 mmol/L — ABNORMAL HIGH (ref 98–107)
Co2: 27 mmol/L (ref 21–32)
EGFR (African American): >60
EGFR (Non-African Amer.): >60
Glucose: 83 mg/dL (ref 65–99)
Lymphocyte #: 3.6 10*3/uL (ref 1.0–3.6)
Lymphocyte %: 41.6 %
MCH: 29.7 pg (ref 26.0–34.0)
MCHC: 32.9 g/dL (ref 32.0–36.0)
Monocyte #: 0.6 x10 3/mm (ref 0.2–0.9)
Neutrophil %: 48.7 %
Osmolality: 280 (ref 275–301)
Platelet: 247 10*3/uL (ref 150–440)
RBC: 4.62 10*6/uL (ref 3.80–5.20)
SGOT(AST): 15 U/L (ref 15–37)
SGPT (ALT): 20 U/L
Sodium: 142 mmol/L (ref 136–145)
Thyroid Stimulating Horm: 0.981 u[IU]/mL
Total Protein: 6.5 g/dL (ref 6.4–8.2)

## 2011-09-28 LAB — PROTIME-INR
INR: 1.2
Prothrombin Time: 15.1 secs — ABNORMAL HIGH (ref 11.5–14.7)

## 2011-09-30 LAB — PROTIME-INR: Prothrombin Time: 18.2 secs — ABNORMAL HIGH (ref 11.5–14.7)

## 2011-10-02 LAB — BASIC METABOLIC PANEL
Chloride: 103 mmol/L (ref 98–107)
Creatinine: 0.81 mg/dL (ref 0.60–1.30)
Osmolality: 276 (ref 275–301)
Potassium: 4.2 mmol/L (ref 3.5–5.1)
Sodium: 138 mmol/L (ref 136–145)

## 2011-10-02 LAB — PROTIME-INR
INR: 1.8
Prothrombin Time: 20.8 secs — ABNORMAL HIGH (ref 11.5–14.7)

## 2011-10-03 LAB — PROTIME-INR
INR: 2.1
Prothrombin Time: 23.5 secs — ABNORMAL HIGH (ref 11.5–14.7)

## 2012-04-17 IMAGING — CR DG CHEST 1V PORT
1 series · 1 of 1 positions shown · non-contrast
Comparison: none

REASON FOR EXAM: Shortness of Breath
COMMENTS:

PROCEDURE:     DXR - DXR PORTABLE CHEST SINGLE VIEW  - July 06, 2011  [DATE]
RESULT:     Right lower lobe infiltrate is noted consistent with pneumonia.
The left lung is clear. The patient has had prior cervical spine fusion. The
cardiovascular structures are unremarkable.

[portable]
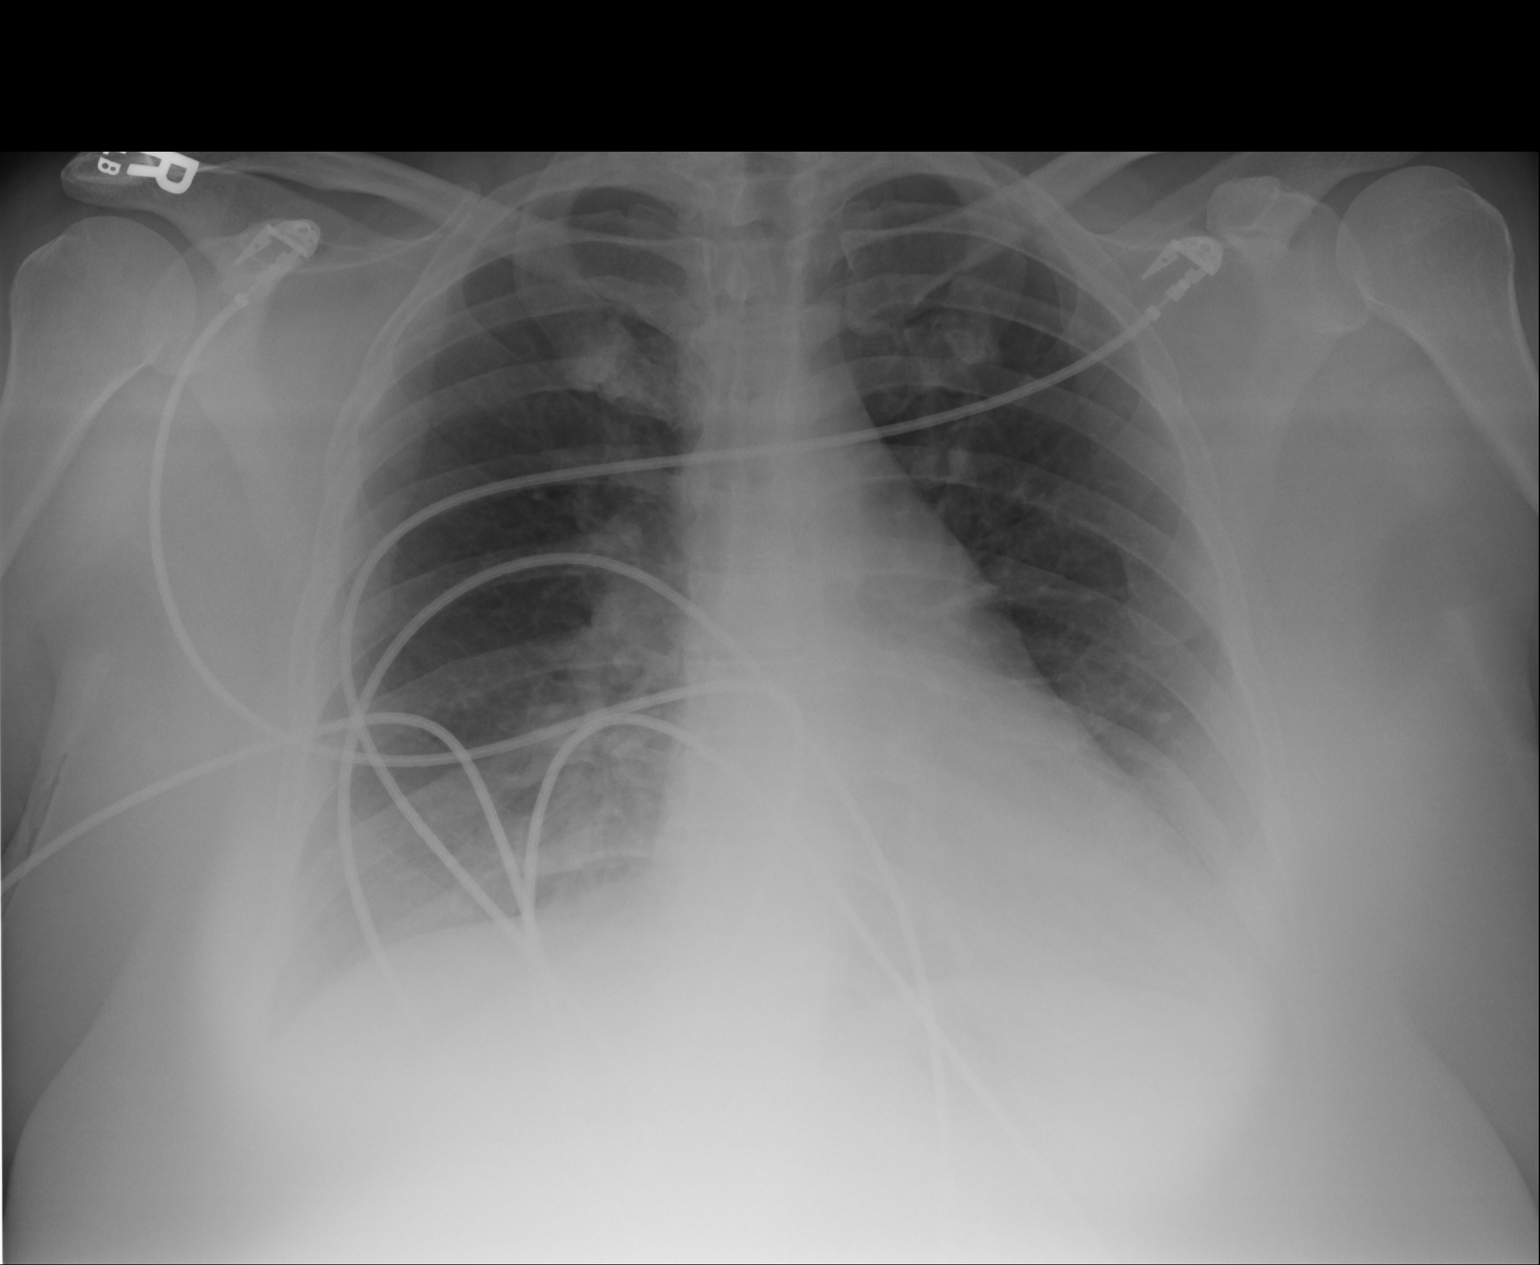

[1 of 1 positions shown; findings below may reference images not displayed]

IMPRESSION: Right lower lobe infiltrate consistent with pneumonia.

## 2012-05-18 ENCOUNTER — Emergency Department: Payer: Self-pay | Admitting: Emergency Medicine

## 2012-05-18 LAB — COMPREHENSIVE METABOLIC PANEL
Albumin: 4 g/dL (ref 3.4–5.0)
BUN: 11 mg/dL (ref 7–18)
Chloride: 108 mmol/L — ABNORMAL HIGH (ref 98–107)
Co2: 28 mmol/L (ref 21–32)
Creatinine: 0.82 mg/dL (ref 0.60–1.30)
Glucose: 84 mg/dL (ref 65–99)
Osmolality: 280 (ref 275–301)
SGOT(AST): 21 U/L (ref 15–37)
SGPT (ALT): 32 U/L (ref 12–78)
Sodium: 141 mmol/L (ref 136–145)
Total Protein: 7.3 g/dL (ref 6.4–8.2)

## 2012-05-18 LAB — CBC
HCT: 46.2 % (ref 35.0–47.0)
MCHC: 32.9 g/dL (ref 32.0–36.0)
MCV: 89 fL (ref 80–100)
Platelet: 338 10*3/uL (ref 150–440)
RBC: 5.22 10*6/uL — ABNORMAL HIGH (ref 3.80–5.20)

## 2012-05-18 LAB — URINALYSIS, COMPLETE
Ketone: NEGATIVE
Leukocyte Esterase: NEGATIVE
Nitrite: NEGATIVE
Ph: 6 (ref 4.5–8.0)
RBC,UR: 1387 /HPF (ref 0–5)
WBC UR: 10 /HPF (ref 0–5)

## 2012-05-18 LAB — PROTIME-INR
INR: 3.7
Prothrombin Time: 36.6 secs — ABNORMAL HIGH (ref 11.5–14.7)

## 2013-02-28 IMAGING — CT CT ABD-PELV W/ CM
1 of 3 series · 14 of 32 positions shown, 19 images · non-contrast
Comparison: none

REASON FOR EXAM: (1) RIGHT FLANK PAIN, RULE OUT RETROPERITONEAL HEMATOMA;
(2) RIGHT FLANK PAIN
COMMENTS:

[Series 2: 3mm soft tissue · axial · 0.98mm/px · z∈[-540,-105]mm · 14 of 167 slices shown, 19 images]
[im 11/167  soft-tissue]
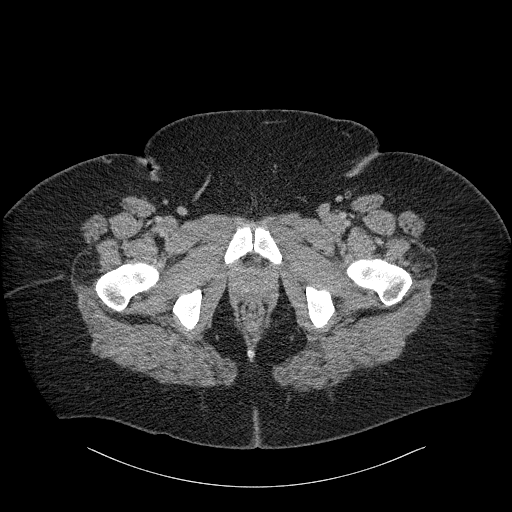
[im 11/167  bone]
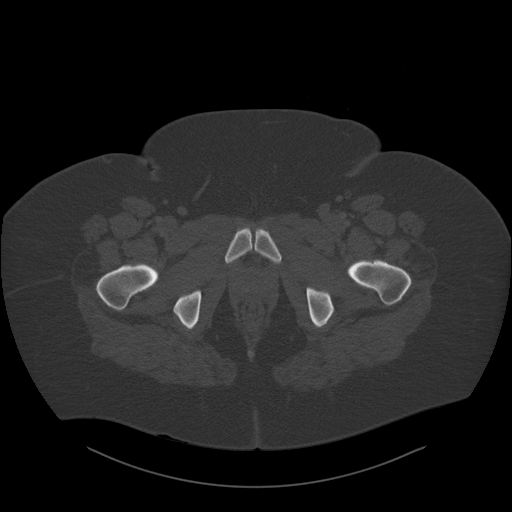
[im 21/167  soft-tissue]
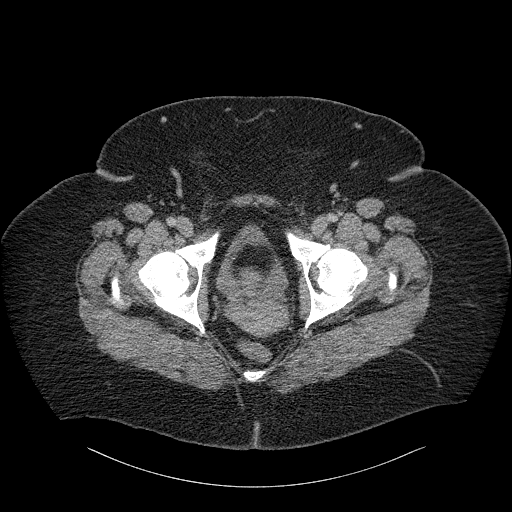
[im 32/167  soft-tissue]
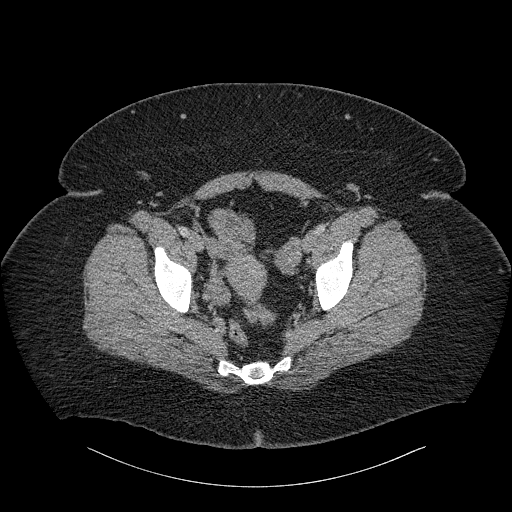
[im 52/167  soft-tissue]
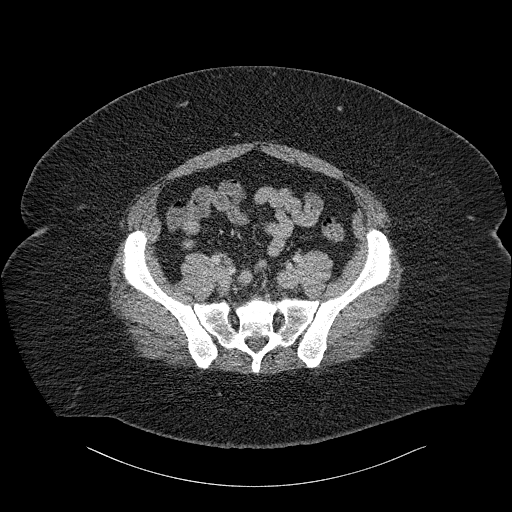
[im 63/167  soft-tissue]
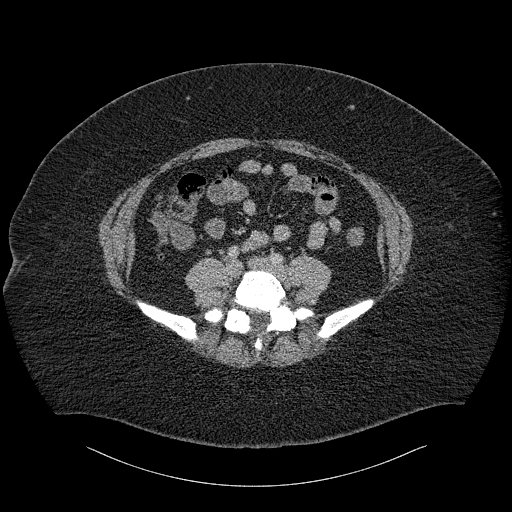
[im 73/167  soft-tissue]
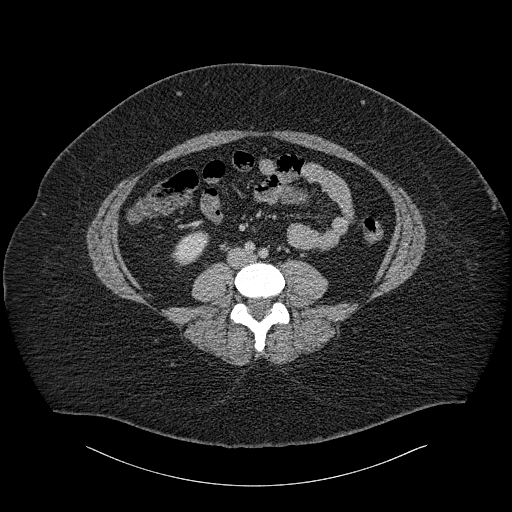
[im 84/167  soft-tissue]
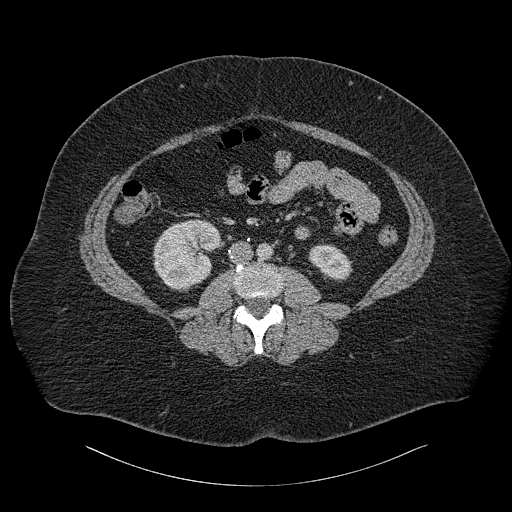
[im 94/167  soft-tissue]
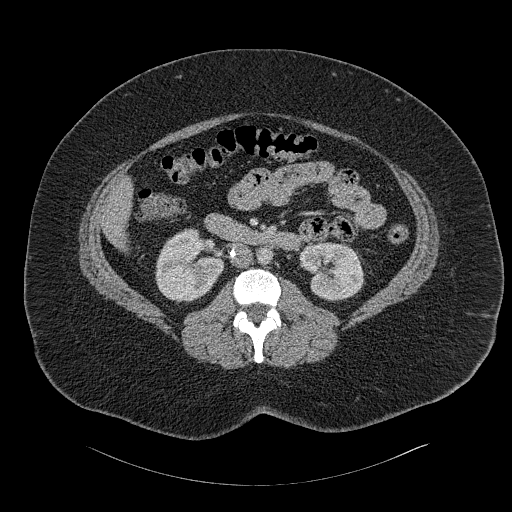
[im 104/167  soft-tissue]
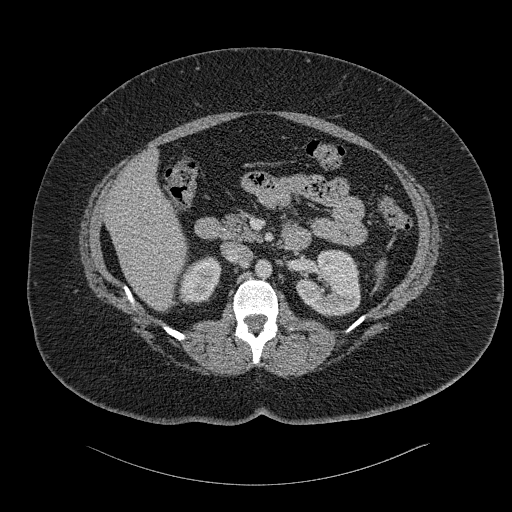
[im 104/167  bone]
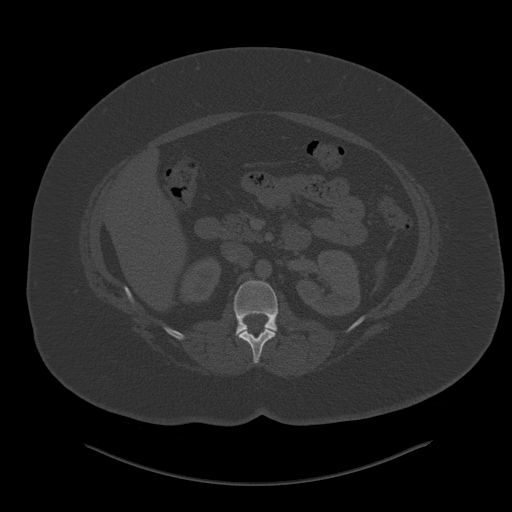
[im 115/167  soft-tissue]
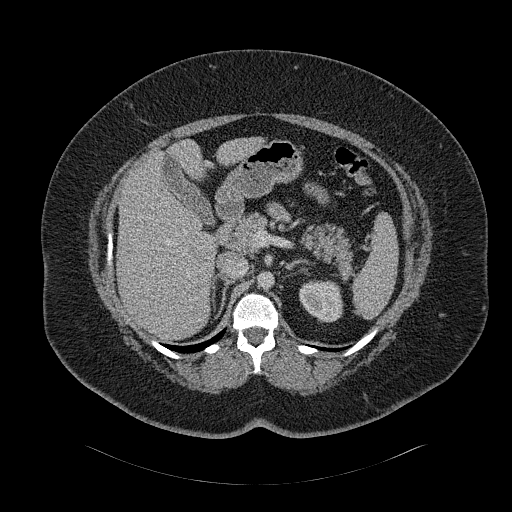
[im 125/167  lung]
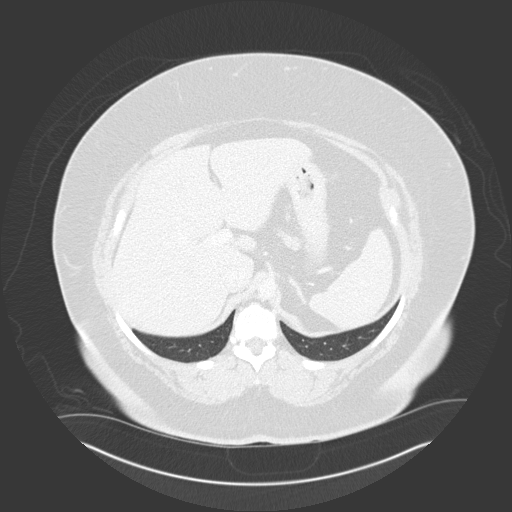
[im 135/167  soft-tissue]
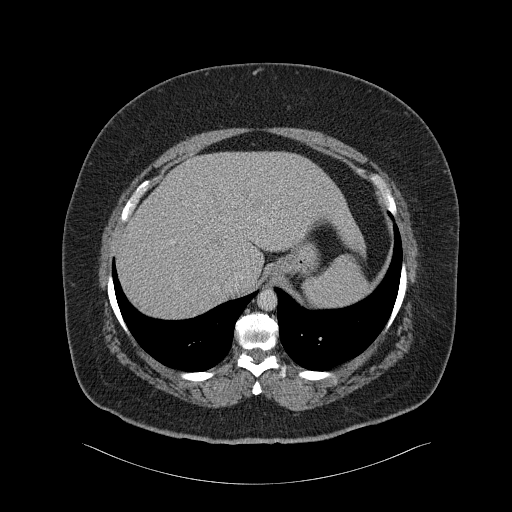
[im 135/167  lung]
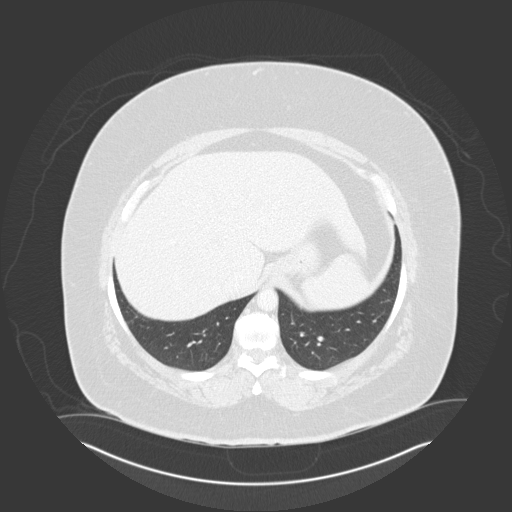
[im 146/167  soft-tissue]
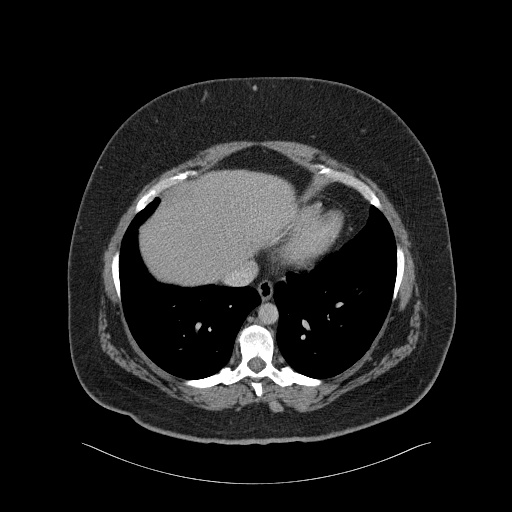
[im 146/167  lung]
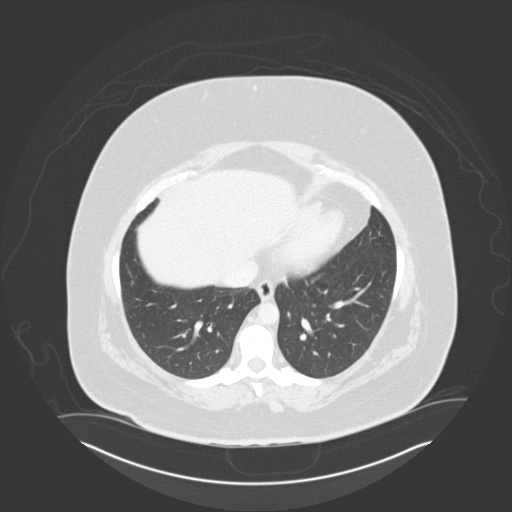
[im 156/167  soft-tissue]
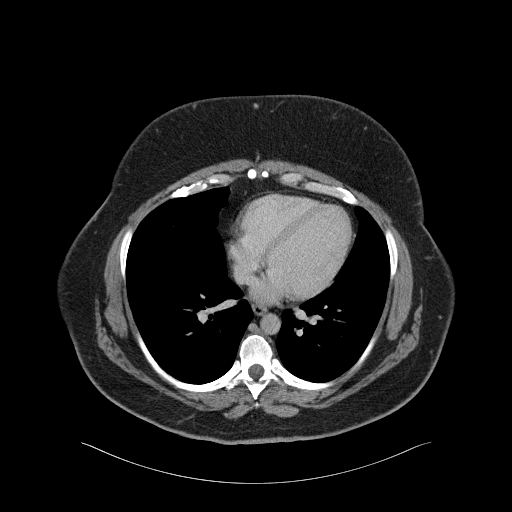
[im 156/167  lung]
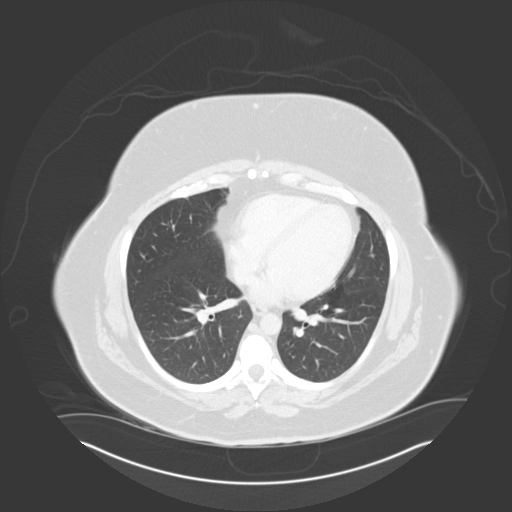

[14 of 32 positions shown; findings below may reference images not displayed]

PROCEDURE:     CT  - CT ABDOMEN / PELVIS  W  - May 18, 2012 [DATE]

RESULT:     CT of the abdomen and pelvis is performed with 100 mL of
Jsovue-0KK iodinated intravenous contrast without oral contrast. Images are
reconstructed at 3 mm slice thickness in the axial plane. Postcontrast
delayed images are obtained through the kidneys. Images are reconstructed at
3 mm slice thickness in the axial plane. There is no previous CT of the
abdomen and pelvis for comparison.

An inferior vena cava filter is present. One of the legs protrudes through
the inferior vena cava wall and is near the posterior aspect of the aorta on
images #48 and #49. Evaluation with the Syngo.Via reconstruction shows this
is near a lumbar branch. Vascular Interventional consultation is recommended.

The lung bases appear clear. The kidneys show no definite obstructive
change. The liver, spleen, pancreas, adrenal glands, kidneys and gallbladder
appear unremarkable. The appendix is normal. The uterus and adnexal
structures appear unremarkable. There is no abnormal bowel distention, wall
thickening, ascites or abnormal fluid collection. No acute inflammatory
stranding is evident. The bony structures appear unremarkable.
IMPRESSION: 1. No evidence of retroperitoneal hemorrhage.
2. One of the legs of the inferior vena cava filter protrudes through the
wall of the inferior vena cava and is adjacent to a branch from the aorta as
described above. Vascular Interventional consultation is recommended to
determine whether this is clinically significant.
3. No acute abdominal or pelvic abnormality demonstrated.

[REDACTED]

## 2014-08-01 NOTE — Discharge Summary (Signed)
PATIENT NAME:  Sharon Strickland, Sharon Strickland MR#:  [id number] DATE OF BIRTH:  10-01-1970  DATE OF ADMISSION:  05/16/2011 DATE OF DISCHARGE:  05/19/2011  DISCHARGE DIAGNOSES:  1. Depression. 2. Anxiety. 3. Diabetes. 4. Hypertension. 5. History of recurrent DVT/PE, on Coumadin.  6. Gastroesophageal reflux disease.  7. Acute hypoxic respiratory failure due to overdose.  8. Metabolic encephalopathy due to overdose of Klonopin and Vicodin. 9. Overdose coagulopathy. 10. B12 deficiency.  11. Hypokalemia.   DISPOSITION: The patient is under involuntary commitment and she is being transferred to Behavioral Medicine under the care of Dr. Maryruth BunKapur.   DIET: Low sodium, 1800 calorie ADA diet.   ACTIVITY: As tolerated.   DISCHARGE MEDICATIONS:  1. Klonopin 0.5 mg t.i.d. p.r.n.  2. Novolin insulin sliding scale.  3. Vitamin B12 1000 mcg daily.  4. Zoloft 50 mg daily.  5. Trazodone 100 mg at bedtime.  6. Clindamycin 300 mg t.i.d. for three days.   LABORATORY, DIAGNOSTIC, AND RADIOLOGICAL DATA: Chest x-ray showed haziness of both lungs more prominent on the left, atelectasis versus pneumonia. Vitamin B12 was low. Tylenol level was 62 on admission and has normalized by the time of discharge. INR was 2.9 on admission, went up to more than 15, 3.8 by the time of discharge. White count 13.7 on admission, 10.4 by the time of discharge. Hemoglobin 13.7 on admission, 11.8 by the time of discharge. Normal platelet count. Essentially normal LFTs and normal CMP. TSH normal. Urine drug screen positive for opiates and MDMA.   HOSPITAL COURSE: The patient is a 44 year old female with past medical history of anxiety, depression, recurrent PE/DVT on Coumadin, gastroesophageal reflux disease, diabetes, and hypertension who was admitted with Vicodin and Klonopin overdose. She also developed Tylenol toxicity.  1. Acute hypoxic respiratory failure. The patient became hypoxic and was treated with oxygen supplementation. Currently she  has got good oxygen saturation on room air. Chest x-ray showed possibility of aspiration pneumonia. She was treated with IV clindamycin during the hospitalization and has been switched to oral clindamycin by the time of discharge.  2. Unresponsiveness. This was likely felt to be due to metabolic encephalopathy due to Vicodin and Klonopin overdose. The patient had Tylenol toxicity with elevated Tylenol level on admission. She was treated with Mucomyst drip. She also was initially on a Narcan drip. Currently she is awake, alert, oriented x3. Her Tylenol level has normalized. Her LFTs are normal.  3. Coagulopathy. The patient's INR became elevated during the hospitalization possibly due to result of Tylenol toxicity. The patient was given FFP and Vitamin K and her INR has come down to 3.8. Should continue to hold Coumadin for the time being.  4. Depression/anxiety. Dr. Maryruth BunKapur has transferred the patient to Behavioral Medicine for further management.  5. Diabetes. The patient's metformin is on hold. She is on insulin sliding scale. Should hold metformin until the patient's p.o. intake is consistently good.  6. Hypertension, remained well controlled on lisinopril. 7. Gastroesophageal reflux disease. Is being treated with PPI.  8. B12 deficiency. B12 level was low, therefore, she has been started on oral supplementation.  9. Hypokalemia. Was replaced. 10. History of recurrent DVT/PE. The patient's INR is elevated at present. Should resume Coumadin once her INR is below 3.   DISPOSITION: The patient is being transferred to Behavioral Medicine in stable condition.   TIME SPENT: 45 minutes. ____________________________ Darrick MeigsSangeeta Tra Wilemon, MD sp:drc D: 05/19/2011 15:39:01 ET T: 05/19/2011 16:02:09 ET JOB#: [id number] cc: Darrick MeigsSangeeta Sabria Florido, MD, <Dictator> Darrick MeigsSANGEETA Edwina Grossberg MD ELECTRONICALLY SIGNED  05/20/2011 15:23 

## 2014-08-01 NOTE — Discharge Summary (Signed)
PATIENT NAME:  NETRA, POSTLETHWAIT MR#:  [id number] DATE OF BIRTH:  [birth year]  DATE OF ADMISSION:  09/26/2011 DATE OF DISCHARGE:  10/04/2011  HISTORY OF PRESENT ILLNESS:  Ms. Kayal Mula is a 44 year old female who was admitted on 09/26/2011 due to severe depression after an overdose of suicidal intent.   She did have some disagreements with her homosexual partner as well as her daughter. She had been experiencing over two weeks of depressed mood, low energy, difficulty concentrating, anhedonia, low appetite and insomnia. She had been started on lithium 900 mg daily added to Zoloft 150 mg daily the week prior. However, she had been noncompliant with it and she was early into the lithium augmentation trial.   She overdosed on Klonopin.   She reported that she came off all neuroleptics due to QTC prolongation which was discovered at [facility] during a psychiatric admission there. They stopped her trazodone as well. They stopped her Risperdal antipsychotic.   She did not present with any hallucinations or delusions. Her thought process was organized. Her memory and orientation function were intact.   ANCILLARY CLINICAL DATA: The pharmacy Coumadin protocol was activated. They adjusted her dosage and advised a discharge dosage of 9 mg of Coumadin per day.   She was continued on her metformin for diabetes mellitus. She was continued on Prazosin for hypertension.   HOSPITAL COURSE: Her lithium trial at 900 mg at bedtime for augmentation of Zoloft was continued. Zoloft was continued at 150 mg daily as her primary antidepressant and antianxiety agent for posttraumatic stress disorder. A follow-up lithium level was taken at five day study state with the level between 0.6 and 0.7. Follow-up basic metabolic panel was unremarkable.   Ms. Lenig initially resisted being in the hospital, however, after one day she acknowledged her necessity of being back in a psychiatric hospital and desire to come out  of depression.   She engaged in milieu and group psychotherapy. Her mood progressively improved. Her interest returned. Her hope returned. All suicidal ideation resolved by 06/26.   CONDITION ON DISCHARGE: By the 27th of June, Ms. Najarro continues with normal mood and interests. She has no thoughts of harming herself or others. She is socially appropriate. She is not having any adverse medication effect.   EXAMINATION UPON DISCHARGE: GENERAL APPEARANCE: Ms. Begeman is a well developed, well nourished, middle-aged female sitting in a chair with no abnormal involuntary movement. She has no cachexia. Her muscle tone is normal. Her grooming and hygiene are normal.   MENTAL STATUS EXAM: Ms. Heggs is alert. Her eye contact is good. Concentration is normal. She is oriented to all spheres. Memory is intact to immediate, recent, and remote. Fund of knowledge, intelligence, and use of language are normal. Speech involves normal rate and prosody without dysarthria. Thought process is logical, coherent, and goal directed. No looseness of associations. Thought content: No thoughts of harming herself or others. No delusions or hallucinations. Insight intact. Judgment intact. Affect broad and appropriate. Mood within normal limits.   DISCHARGE DIAGNOSES:  AXIS I:  1. Major depressive disorder, recurrent, in partial clinical remission.  2. Posttraumatic stress disorder, stable.   AXIS II: Deferred.   AXIS III:  1. Diabetes mellitus. 2. Hypertension. 3. History of pulmonary emboli, on Coumadin. See past medical history.   AXIS IV: Primary support group.   AXIS V: 55.   Ms. Profeta is not at risk to harm herself or others. She agrees to call emergency services immediately for any thoughts  of harming herself, thoughts of harming others, or distress.   She agrees to not drive if drowsy.   Regarding the home environment, upon discharge she has met with her daughter and partner. They have engaged in  mutually supportive conversation and the support has been restored among them.   DISCHARGE MEDICATIONS:  1. Metformin 500 mg 2 b.i.d.  2. Warfarin 3 mg 3 daily. 3. Albuterol ipratropium INH one puff four times daily.  4. Gabapentin 300 mg q.i.d.  5. Vistaril 25 mg, one to three q.i.d. p.r.n.  6. Lithium carbonate 300 mg three at bedtime. 7. Sertraline 150 mg daily. 8. Prazosin 2 mg one b.i.d.   9. Multivitamin daily.  10. Omeprazole 40 mg daily. 11. Tramadol 50 mg every six hours. 12. Lisinopril 40 mg daily.   FOLLOW-UP APPOINTMENTS:  1. Psychiatric medication appt 07/01 at 10:00 a.m.  2. General medical follow-up, Princella Ion, 07/02 at 2:00 p.m.   DIET: 1,800 low fat and ADA.   ACTIVITY: Routine.   ____________________________ Drue Stager. Jedidiah Demartini, MD jsw:ap D: 10/05/2011 18:32:48 ET T: 10/06/2011 07:54:28 ET JOB#: [id number]  cc: Drue Stager. Zuriel Yeaman, MD, <Dictator>  Billie Ruddy MD ELECTRONICALLY SIGNED 10/10/2011 21:47
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
## 2014-08-01 NOTE — Discharge Summary (Signed)
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 She had taken 20 to 30 Vicodin tablets with suicidal intent. The patient was being treated with Prozac 40 mg daily and Klonopin 0.5 mg three times daily. She was suffering from several days of depressed mood, poor energy, and difficulty concentrating along with suicidal thoughts.   She continued on tramadol and Neurontin for her chronic pain syndrome. She also continued on warfarin for prevention of deep vein thrombosis.   HOSPITAL COURSE: Sharon Strickland was admitted to the inpatient unit and underwent milieu and group psychotherapy. By 05/24/2011, the patient had developed a severe exacerbation of symptoms of posttraumatic stress disorder. She was having severe flashbacks with catastrophic anxiety running down the hospital hallway with her grandfather chasing her in a hallucination, therefore, Risperdal was started and was titrated to 2 mg at bedtime, also Zoloft was continued and increased to 150 mg at bedtime. By 05/28/2011, Sharon Strickland was no longer having suicidal thought, she was sleeping well, and was not having nightmares or flashbacks. She was in a good mood with normal affect.  CONDITION ON DISCHARGE: Sharon Strickland is showing a good mood with normal affect. She is not having any suicidal thoughts or thoughts of harming others. She has normal thought process; logical, coherent, and goal directed. She is not having any form of hallucinations.   MENTAL STATUS EXAM UPON DISCHARGE: Sharon Strickland is alert and oriented. She has normal dress and grooming. She is socially cooperative. Her hygiene is good. Speech is normal. Her mood is "good". Affect is normal. Thought process logical, coherent, and goal  directed. No looseness of associations. Thought content - no suicidal ideation. No homicidal ideation. She is able to contract for safety on an outpatient basis. She has no auditory hallucinations or visual hallucinations. She has no delusions. Her memory is intact. Insight is intact. Judgment is intact.      DISCHARGE DIAGNOSES:   AXIS I:  1. 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 Of note, the psychotic features with catastrophic anxiety were at the extreme of both her depression and PTSD, however, they were clearly outside the diagnostic criteria for simple flashbacks or intrusive recollections.  2. Posttraumatic stress disorder, improved.   AXIS II: Deferred.   AXIS III: Deep vein thrombosis, history of pulmonary embolus, spinal stenosis, chronic pain, hypertension, diabetes, polycystic ovarian syndrome.   AXIS IV: Primary support group, economic.   AXIS V: 45.   DISCHARGE MEDICATIONS:  1. Metformin 1000 mg twice a day. 2. Klonopin 0.5 mg every 6 hours p.r.n. anxiety. 3. Warfarin 8 mg daily.  4. Gabapentin 300 mg four times daily. 5. Risperdal 2 mg at bedtime. 6. Sertraline 150 mg daily.  7. Prazosin 2 mg daily to prevent flashbacks.  8. Multivitamin daily.  9. Omeprazole 40 mg daily  10. Tylenol 50 mg every 6 hours p.r.n. pain. 11. Lisinopril 40 mg daily. 12. Trazodone 150 mg at bedtime.   DIET: Regular.   ACTIVITY: Routine. Return to work is not applicable.      FOLLOW-UP APPOINTMENTS: Elita Quick, [place], [facility] at 12:45 p.m. on 06/04/2011 for medication management. ____________________________ Adelene Amas. Juaquina Machnik, MD jsw:slb D: 06/07/2011 19:50:00 ET T: 06/08/2011 10:39:25 ET JOB#: [id number]  cc: Adelene AmasJames S. Jerris Fleer, MD, <Dictator> Lester CarolinaJAMES S Branson Kranz MD ELECTRONICALLY SIGNED 06/08/2011 13:26

## 2014-08-01 NOTE — H&P (Signed)
PATIENT NAME:  Sharon Strickland, Sharon Strickland MR#:  [id number] DATE OF BIRTH:  [birth year]  DATE OF ADMISSION:  05/16/2011  PRIMARY CARE PHYSICIAN: Gustavo Lah, MD  CHIEF COMPLAINT: Drug overdose and Tylenol toxicity.   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 The patient says she took about 20 to 30 pills of Vicodin in an attempt to kill herself. She got some Narcan in the field and improved shortly thereafter. She was brought to the ER for further evaluation. The patient has had chronic chest pain for quite a while with a negative work-up. She has high drug and pain seeking behavior. The patient has been previously admitted to the psychiatric service for bipolar and depression, in July of this past year. Given the fact that she took an increasing amount of Tylenol and Tylenol level being elevated, she is currently being admitted to the hospitalist service.   REVIEW OF SYSTEMS: CONSTITUTIONAL: No documented fever. No weight gain. No weight loss. HEENT: No blurry or double vision. ENT: No tinnitus or postnasal drip. No redness of the oropharynx. RESPIRATORY: No cough, no wheeze, no hemoptysis, and no dyspnea. CARDIOVASCULAR: Chronic chest pain. No orthopnea, no palpitations, and no syncope. GASTROINTESTINAL: No nausea, no vomiting, no diarrhea, no abdominal pain, no melena, and no hematochezia. GU: No dysuria or hematuria. ENDOCRINE: No polyuria or nocturia. No heat or cold intolerance.  HEME: No anemia. No bruising or bleeding. INTEGUMENTARY: No rashes and no lesions. MUSCULOSKELETAL: No arthritis, no swelling, and no gout. NEUROLOGIC: No numbness, no tingling, no ataxia, and no seizure activity. PSYCH: Positive anxiety. Positive depression. No ADD.   PAST MEDICAL HISTORY:  1. Diabetes.  2. Hypertension. 3. Depression/anxiety. 4. 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 5. Gastroesophageal reflux  disease.  ALLERGIES: No known drug allergies.   SOCIAL HISTORY: She does smoke and has been smoking for the past 15 plus years, about 3 to 5 cigarettes per day. No alcohol abuse. No illicit drug abuse.   FAMILY HISTORY: History is significant for diabetes, hypertension, and history of cancer in both mother's and father's side of the family.   CURRENT MEDICATIONS:  1. Tylenol with hydrocodone 10/325 mg one tablets every 6 hours as needed. 2. Klonopin 0.5 mg three times daily. 3. Lasix 40 mg daily.  4. Lisinopril 40 mg daily.  5. Metformin 500 mg 2 tabs twice a day. 6. Omeprazole 40 mg daily.  7. Prozac 40 mg daily.  8. Trazodone 100 mg at bedtime.  9. Warfarin 5 mg daily.   ADMISSION PHYSICAL EXAMINATION:   VITAL SIGNS: She is afebrile, pulse 79, respirations 22, blood pressure 99/69, and saturation 96% on room air.   GENERAL: She is a pleasant appearing female in no apparent distress.   HEENT: Atraumatic, normocephalic. Her extraocular muscles are intact. Pupils are equal and reactive to light. Sclerae anicteric. No conjunctival injection. No oropharyngeal erythema.   NECK: Supple. No jugular venous distention. No bruits, no lymphadenopathy, and no thyromegaly.   HEART: Regular rate and rhythm. No murmurs, no rubs, and no clicks.   LUNGS: Clear to auscultation bilaterally. No rales, no rhonchi, and no wheezes.   ABDOMEN: Soft, flat, nontender, and nondistended. Has good bowel sounds. No hepatosplenomegaly is appreciated.   EXTREMITIES: No evidence of any cyanosis, clubbing, or peripheral edema. Has +2 pedal and radial pulses bilaterally.   NEUROLOGICAL: The patient is alert, awake, and oriented x3. No focal motor or  sensory deficits are appreciated.   SKIN: Moist and warm with no rash appreciated.   LYMPHATIC: No cervical or axillary lymphadenopathy.   LABS/STUDIES: Serum glucose 94, BUN 10, creatinine 0.7, sodium 142, potassium 3.6, chloride 102, bicarbonate 30. Liver  function tests are within normal limits. Albumin is 3.6. Her TSH is 1.9. White cell count is 11.5, hemoglobin 13.1, hematocrit 39.1, and platelet count 336. Tylenol level is 62. Salicylates are less than 1.7.   ASSESSMENT AND PLAN: This is a 44 year old female with history of depression and anxiety, diabetes, hypertension, history of recurrent deep vein thrombosis and pulmonary embolus, on Coumadin, and gastroesophageal reflux disease  who presents to the hospital with drug overdose and Tylenol toxicity.  1. Tylenol toxicity: The patient apparently took about 20 to 30 pills of Vicodin earlier this morning. She cannot recall the exact time when she did. Her Tylenol level is mildly elevated. I discussed with Poison Control. She likely needs at least 24 hours of an acetylcysteine drip. She will be loaded with 150 mg with 15 mg per hour drip. I will recheck her liver function tests and INR tomorrow. If her Tylenol level is coming down and LFTs remain stable, then she likely can be taken off the acetylcysteine drip.  2. Depression/anxiety: This is a suicide attempt for her. I will place her on involuntary commitment. She will have a sitter and we will get a psychiatric consult. We will continue her Prozac and Klonopin for now.  3. Diabetes: Hold her metformin, place her on sliding scale insulin, and continue carb-controlled diet.  4. Hypertension: Continue lisinopril.  5. Gastroesophageal reflux disease: Continue Prilosec.           6. 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 INR is therapeutic.   CODE STATUS: THE PATIENT IS A FULL CODE.   TIME SPENT: 50 minutes. ____________________________ Rolly PancakeVivek J. Cherlynn KaiserSainani, MD vjs:slb D: 05/16/2011 14:37:42 ET T: 05/16/2011 15:00:38 ET JOB#: [id number]  cc: Rolly PancakeVivek J. Cherlynn KaiserSainani, MD, <Dictator> Paul Halferesa A. Bernette MayersFralix, MD Houston SirenVIVEK J Thera Basden MD ELECTRONICALLY SIGNED 05/16/2011 17:00

## 2014-08-01 NOTE — Consult Note (Signed)
Psychological Assessment  Sharon Simpson40of Evaluation: Beginning approximately 2-10-13Administered: [place] Multiphasic Personality Inventory-2 (MMPI-2) for Referral: Ms. Sharon HongSimpson was referred for a psychological assessment by her physician, Caryn SectionAarti Kapur, MD.  She was admitted to Behavioral Medicine for treatment after overdosing with suicidal intent.  Please see the history and physical and psychosocial history for further background information. An assessment of personality structure was requested. Sharon Strickland?s MMPI-2 protocol is compared to that of other adult females she obtained the following profile: [id number]?+-/59:#. The MMPI-2 validity scales indicate that the clinical profile is possibly valid. They also suggest that she may have exaggerated her symptoms in an attempt to call attention to the significant distress she experiences. PresentationShe reports that she is experiencing a moderate level of emotional distress that is characterized by dysphoria, anger and anhedonia. She frequently worries about something or someone. She believes she is more nervous than most others. She is more sensitive and feels more intensely than most people. Her feelings are easily hurt and she is inclined to take things hard. She easily becomes impatient with people. She is often irritable and grouchy and generally feels angry with both herself and others. It makes her angry when people hurry her and she gets angry with herself for giving in to others so much.  She reports that she has problems with attention, concentration and memory. She often gets confused and forgets what she wants to say. Her judgment is not as good as it was in the past. She lacks self-confidence, believes that she is not as good as other people and thinks she is useless at times. She is apt to take disappointments so keenly that she cannot put them out of her mind. She has a hard time making decisions and she feels helpless when she has to  make some important decisions. She has sometimes thought that difficulties were piling up so high that she could not overcome them. She thinks that there is something wrong with her mind and thinks that she is about to go to pieces. Several times a week she thinks something dreadful is about to happen. She is sensitive to any form of criticism, assumes others are against her, and anticipates being rejected. She is sure that she is being talked about and that strangers are looking at her critically. She is sure that she gets a raw deal from life and that she has been punished without cause. She often wonders what hidden reason another person may have for doing something nice for her. In everything she does lately, she thinks she is being tested. She believes that it is safer to trust nobody. She has often been misunderstood when she was trying to be helpful. She is not happy with herself the way she is and wishes that she could be as happy as others seem to be. Relations: She reports that she is introverted. She finds it hard to talk when she is in a group of people and spends most of her spare time by herself. Whenever possible she avoids being in a crowd. She feels lonely even when she is with people. She does not seem to make friends as quickly as others do. She reports mild conflict with members of her family. She is alienated from herself and others. Problem Areas: She reports a number of physical symptoms and she is generally concerned about her health. She is not in as good physical health as most of her friends. She has difficulty going to sleep because thoughts or ideas are  bothering her, her sleep is fitful and disturbed and she does not wake up fresh and rested most mornings. She tires quickly and feels tired, weak, and without energy a good deal of the time. She does not have enough energy to do her work and she is not as able to work as she once was. There is a theme of hopelessness that pervades her  thoughts so suicidal ideation should be monitored on an ongoing basis. Her prognosis is guarded because her anger and brooding resentment make it very difficult to develop a therapeutic alliance. Short-term, behavioral interventions that focus on her reasons for entering treatment will be most beneficial. Cognitive-behavioral therapy that focuses on her dysphoric mood and anger also may be appropriate.  

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
## 2015-12-21 ENCOUNTER — Other Ambulatory Visit: Payer: Self-pay | Admitting: Podiatry
